# Patient Record
Sex: Female | Born: 1990 | Hispanic: Yes | Marital: Married | Smoking: Former smoker
Health system: Southern US, Community
[De-identification: ages and names within clinical notes are randomized; demographics above are authoritative.]

## PROBLEM LIST (undated history)

## (undated) DIAGNOSIS — J45909 Unspecified asthma, uncomplicated: Secondary | ICD-10-CM

## (undated) HISTORY — PX: CHOLECYSTECTOMY: SHX55

---

## 2015-03-28 NOTE — L&D Delivery Note (Signed)
Patient is 25 y.o. G2P1001 9372w5d admitted SOL   Delivery Note At 7:57 AM a viable female was delivered via  (Presentation: cephalic; ROA ).  APGAR: 8, 9; weight pending .   Placenta status: intact.  Cord: 3-vessel. Without complications  Anesthesia:  epidural Episiotomy:  none Lacerations:  none Suture Repair: none Est. Blood Loss (mL):  75  Mom to postpartum.  Baby to Couplet care / Skin to Skin.  Leland HerElsia J Yoo 12/25/2015, 8:12 AM      Upon arrival patient was complete and pushing. She pushed with good maternal effort to deliver a healthy baby girl. Baby delivered without difficulty, was noted to have good tone and place on maternal abdomen for oral suctioning, drying and stimulation. Delayed cord clamping performed. Placenta delivered intact with 3V cord. Vaginal canal and perineum was inspected and no repairs; hemostatic. Pitocin was started and uterus massaged until bleeding slowed. Counts of sharps, instruments, and lap pads were all correct.   Leland HerElsia J Yoo, DO PGY-1 9/30/20178:12 AM   Patient is a G2P1001 at 7172w5d who was admitted in SOL, uncomplicated prenatal course from records received.  She progressed with augmentation via Pitocin which was recently started.  I was gloved and present for delivery in its entirety.  Second stage of labor progressed to SVD.  Mild decels during second stage noted.  Complications: none  Lacerations: none  EBL: 75cc  Cam HaiSHAW, Shandon Matson, CNM 9:26 AM  12/25/2015

## 2015-08-31 LAB — OB RESULTS CONSOLE RUBELLA ANTIBODY, IGM: RUBELLA: IMMUNE

## 2015-10-14 LAB — OB RESULTS CONSOLE HIV ANTIBODY (ROUTINE TESTING): HIV: NONREACTIVE

## 2015-10-14 LAB — OB RESULTS CONSOLE RPR: RPR: NONREACTIVE

## 2015-11-19 LAB — OB RESULTS CONSOLE GC/CHLAMYDIA
CHLAMYDIA, DNA PROBE: NEGATIVE
GC PROBE AMP, GENITAL: NEGATIVE

## 2015-11-19 LAB — OB RESULTS CONSOLE GBS: GBS: NEGATIVE

## 2015-12-24 ENCOUNTER — Inpatient Hospital Stay (HOSPITAL_COMMUNITY)
Admission: AD | Admit: 2015-12-24 | Discharge: 2015-12-27 | DRG: 774 | Disposition: A | Discharge status: Home / Self Care | Payer: Medicaid Other | Source: Ambulatory Visit | Attending: Obstetrics & Gynecology | Admitting: Obstetrics & Gynecology

## 2015-12-24 ENCOUNTER — Encounter (HOSPITAL_COMMUNITY): Payer: Self-pay

## 2015-12-24 ENCOUNTER — Inpatient Hospital Stay (HOSPITAL_COMMUNITY): Payer: Medicaid Other | Admitting: Anesthesiology

## 2015-12-24 DIAGNOSIS — Z87891 Personal history of nicotine dependence: Secondary | ICD-10-CM | POA: Diagnosis not present

## 2015-12-24 DIAGNOSIS — Z3A40 40 weeks gestation of pregnancy: Secondary | ICD-10-CM

## 2015-12-24 DIAGNOSIS — IMO0001 Reserved for inherently not codable concepts without codable children: Secondary | ICD-10-CM

## 2015-12-24 DIAGNOSIS — Z3483 Encounter for supervision of other normal pregnancy, third trimester: Secondary | ICD-10-CM | POA: Diagnosis present

## 2015-12-24 HISTORY — DX: Unspecified asthma, uncomplicated: J45.909

## 2015-12-24 LAB — CBC
HEMATOCRIT: 42.7 % (ref 36.0–46.0)
Hemoglobin: 15.2 g/dL — ABNORMAL HIGH (ref 12.0–15.0)
MCH: 31.7 pg (ref 26.0–34.0)
MCHC: 35.6 g/dL (ref 30.0–36.0)
MCV: 89.1 fL (ref 78.0–100.0)
Platelets: 278 10*3/uL (ref 150–400)
RBC: 4.79 MIL/uL (ref 3.87–5.11)
RDW: 13.3 % (ref 11.5–15.5)
WBC: 13.8 10*3/uL — AB (ref 4.0–10.5)

## 2015-12-24 LAB — TYPE AND SCREEN
ABO/RH(D): O POS
Antibody Screen: NEGATIVE

## 2015-12-24 LAB — ABO/RH: ABO/RH(D): O POS

## 2015-12-24 LAB — HEPATITIS B SURFACE ANTIGEN: Hepatitis B Surface Ag: NEGATIVE

## 2015-12-24 LAB — POCT FERN TEST
POCT Fern Test: NEGATIVE
POCT Fern Test: NEGATIVE

## 2015-12-24 MED ORDER — LACTATED RINGERS IV SOLN: 500.0000 mL | Freq: Once | INTRAVENOUS | Status: DC

## 2015-12-24 MED ORDER — ONDANSETRON HCL 4 MG/2ML IJ SOLN: 4.0000 mg | Freq: Four times a day (QID) | INTRAMUSCULAR | Status: DC | PRN

## 2015-12-24 MED ORDER — OXYTOCIN 40 UNITS IN LACTATED RINGERS INFUSION - SIMPLE MED
2.5000 [IU]/h | INTRAVENOUS | Status: DC
  Filled 2015-12-24: qty 1000

## 2015-12-24 MED ORDER — EPHEDRINE 5 MG/ML INJ
10.0000 mg | INTRAVENOUS | Status: DC | PRN
  Filled 2015-12-24: qty 4

## 2015-12-24 MED ORDER — OXYCODONE-ACETAMINOPHEN 5-325 MG PO TABS: 1.0000 | ORAL_TABLET | ORAL | Status: DC | PRN

## 2015-12-24 MED ORDER — FENTANYL CITRATE (PF) 100 MCG/2ML IJ SOLN: 100.0000 ug | INTRAMUSCULAR | Status: DC | PRN

## 2015-12-24 MED ORDER — PHENYLEPHRINE 40 MCG/ML (10ML) SYRINGE FOR IV PUSH (FOR BLOOD PRESSURE SUPPORT)
80.0000 ug | PREFILLED_SYRINGE | INTRAVENOUS | Status: DC | PRN
  Filled 2015-12-24: qty 5

## 2015-12-24 MED ORDER — LIDOCAINE HCL (PF) 1 % IJ SOLN
INTRAMUSCULAR | Status: DC | PRN
  Administered 2015-12-24: 4 mL via EPIDURAL
  Administered 2015-12-24: 6 mL via EPIDURAL

## 2015-12-24 MED ORDER — DIPHENHYDRAMINE HCL 50 MG/ML IJ SOLN: 12.5000 mg | INTRAMUSCULAR | Status: DC | PRN

## 2015-12-24 MED ORDER — LACTATED RINGERS IV SOLN
INTRAVENOUS | Status: DC
  Administered 2015-12-24 – 2015-12-25 (×4): via INTRAVENOUS

## 2015-12-24 MED ORDER — SOD CITRATE-CITRIC ACID 500-334 MG/5ML PO SOLN: 30.0000 mL | ORAL | Status: DC | PRN

## 2015-12-24 MED ORDER — LACTATED RINGERS IV SOLN
500.0000 mL | INTRAVENOUS | Status: DC | PRN
  Administered 2015-12-24: 500 mL via INTRAVENOUS

## 2015-12-24 MED ORDER — OXYTOCIN BOLUS FROM INFUSION
500.0000 mL | Freq: Once | INTRAVENOUS | Status: AC
  Administered 2015-12-25: 500 mL via INTRAVENOUS

## 2015-12-24 MED ORDER — PHENYLEPHRINE 40 MCG/ML (10ML) SYRINGE FOR IV PUSH (FOR BLOOD PRESSURE SUPPORT)
80.0000 ug | PREFILLED_SYRINGE | INTRAVENOUS | Status: DC | PRN
  Filled 2015-12-24: qty 5
  Filled 2015-12-24: qty 10

## 2015-12-24 MED ORDER — LIDOCAINE HCL (PF) 1 % IJ SOLN
30.0000 mL | INTRAMUSCULAR | Status: DC | PRN
  Filled 2015-12-24: qty 30

## 2015-12-24 MED ORDER — OXYCODONE-ACETAMINOPHEN 5-325 MG PO TABS: 2.0000 | ORAL_TABLET | ORAL | Status: DC | PRN

## 2015-12-24 MED ORDER — FENTANYL 2.5 MCG/ML BUPIVACAINE 1/10 % EPIDURAL INFUSION (WH - ANES)
14.0000 mL/h | INTRAMUSCULAR | Status: DC | PRN
  Administered 2015-12-24 – 2015-12-25 (×2): 14 mL/h via EPIDURAL
  Filled 2015-12-24 (×2): qty 125

## 2015-12-24 MED ORDER — ACETAMINOPHEN 325 MG PO TABS: 650.0000 mg | ORAL_TABLET | ORAL | Status: DC | PRN

## 2015-12-24 NOTE — MAU Note (Signed)
PT having contractions, was not able to sleep last night. Every 30 min.

## 2015-12-24 NOTE — Anesthesia Preprocedure Evaluation (Signed)
Anesthesia Evaluation  Patient identified by MRN, date of birth, ID band Patient awake    Reviewed: Allergy & Precautions, NPO status , Patient's Chart, lab work & pertinent test results  History of Anesthesia Complications Negative for: history of anesthetic complications  Airway Mallampati: II  TM Distance: >3 FB Neck ROM: Full    Dental  (+) Teeth Intact, Dental Advisory Given   Pulmonary asthma , former smoker,    Pulmonary exam normal breath sounds clear to auscultation       Cardiovascular negative cardio ROS   Rhythm:Regular Rate:Normal     Neuro/Psych negative neurological ROS     GI/Hepatic negative GI ROS, Neg liver ROS,   Endo/Other  negative endocrine ROS  Renal/GU negative Renal ROS     Musculoskeletal   Abdominal (+) + obese,   Peds  Hematology negative hematology ROS (+)   Anesthesia Other Findings   Reproductive/Obstetrics (+) Pregnancy                             Anesthesia Physical Anesthesia Plan  ASA: II  Anesthesia Plan: Epidural   Post-op Pain Management:    Induction:   Airway Management Planned: Natural Airway  Additional Equipment:   Intra-op Plan:   Post-operative Plan:   Informed Consent: I have reviewed the patients History and Physical, chart, labs and discussed the procedure including the risks, benefits and alternatives for the proposed anesthesia with the patient or authorized representative who has indicated his/her understanding and acceptance.   Dental advisory given  Plan Discussed with:   Anesthesia Plan Comments: (History and consent obtained through use of a Spanish interpreter. I have discussed risks of neuraxial anesthesia including but not limited to infection, bleeding, nerve injury, back pain, headache, seizures, and failure of block. Patient denies bleeding disorders and is not currently anticoagulated. Labs have been  reviewed. Risks and benefits discussed. All patient's questions answered.   Platelets 278)        Anesthesia Quick Evaluation

## 2015-12-24 NOTE — Anesthesia Pain Management Evaluation Note (Signed)
  CRNA Pain Management Visit Note  Patient: Olivia Nguyen, 25 y.o., female  "Hello I am a [facility] anesthesia [facility]. We have an anesthesia team available at all times to provide care throughout the hospital, including epidural management and anesthesia for C-section. I don't know your plan for the delivery whether it a natural birth, water birth, IV sedation, nitrous supplementation, doula or epidural, but we want to meet your pain goals."   1.Was your pain managed to your expectations on prior hospitalizations?   Yes   2.What is your expectation for pain management during this hospitalization?     IV pain meds  3.How can we help you reach that goal? Spoke to patient through interpretor. Patient currently not planning on getting epidural. Told patient to tell RN if she decides she wants an epidural.  Record the patient's initial score and the patient's pain goal.   Pain: 7  Pain Goal: undecided [facility] wants you to be able to say your pain was always managed very well.  Quavis Klutz 12/24/2015

## 2015-12-24 NOTE — Anesthesia Procedure Notes (Signed)
Epidural Patient location during procedure: OB Start time: 12/24/2015 10:29 PM End time: 12/24/2015 10:33 PM  Staffing Anesthesiologist: Ardeen JourdainALLAN, Salia Cangemi DICKERSON  Preanesthetic Checklist Completed: patient identified, surgical consent, pre-op evaluation, timeout performed, IV checked, risks and benefits discussed and monitors and equipment checked  Epidural Patient position: sitting Prep: site prepped and draped and DuraPrep Patient monitoring: continuous pulse ox and blood pressure Approach: midline Location: L2-L3 Injection technique: LOR air  Needle:  Needle type: Tuohy  Needle gauge: 17 G Needle length: 9 cm and 9 Needle insertion depth: 6 cm Catheter type: closed end flexible Catheter size: 19 Gauge Catheter at skin depth: 11 cm Test dose: negative  Assessment Events: blood not aspirated, injection not painful, no injection resistance, negative IV test and no paresthesia  Additional Notes Reason for block:procedure for pain

## 2015-12-24 NOTE — MAU Note (Signed)
Olivia FatFern results ordered and resulted on the wrong pt. This pt had no fern testing done.

## 2015-12-24 NOTE — H&P (Signed)
Olivia Nguyen is a 25 y.o. female G2P1001 @ 40.4wks presenting for reg ctx. Denies leaking or bldg. [facility] and has been essentially unremarkable.  OB History    Gravida Para Term Preterm AB Living   2 1 1     1    SAB TAB Ectopic Multiple Live Births           1     History reviewed. No pertinent past medical history. Past Surgical History:  Procedure Laterality Date  . CHOLECYSTECTOMY     Family History: family history is not on file. Social History:  reports that she has quit smoking. She has never used smokeless tobacco. She reports that she drinks alcohol. She reports that she uses drugs.     Maternal Diabetes: No Genetic Screening: Declined Maternal Ultrasounds/Referrals: Normal Fetal Ultrasounds or other Referrals:  None Maternal Substance Abuse:  No Significant Maternal Medications:  None Significant Maternal Lab Results:  Lab values include: Group B Strep negative Other Comments:  None  ROS History Dilation: 5 Effacement (%): 90 Station: -2 Exam by:: Dorisann FramesAmanda Jones RN Blood pressure 118/73, pulse 96, temperature 97.8 F (36.6 C), temperature source Oral, resp. rate 17, SpO2 100 %. Exam Physical Exam  Constitutional: She is oriented to person, place, and time. She appears well-developed.  HENT:  Head: Normocephalic.  Neck: Normal range of motion.  Cardiovascular: Normal rate.   Respiratory: Effort normal.  Musculoskeletal: Normal range of motion.  Neurological: She is alert and oriented to person, place, and time.  Skin: Skin is warm and dry.  Psychiatric: She has a normal mood and affect. Her behavior is normal. Thought content normal.    Prenatal labs: ABO, Rh:  O+ Antibody:   Rubella:  immune RPR:   RPR HBsAg:   pending HIV:   NR GBS:   neg  Assessment/Plan: IUP@40 .4wks Early active labor  [facility] Anticipate SVD   Cam HaiSHAW, Colette Dicamillo CNM 12/24/2015, 6:03  PM

## 2015-12-25 ENCOUNTER — Encounter (HOSPITAL_COMMUNITY): Payer: Self-pay | Admitting: *Deleted

## 2015-12-25 DIAGNOSIS — Z87891 Personal history of nicotine dependence: Secondary | ICD-10-CM

## 2015-12-25 DIAGNOSIS — Z3A40 40 weeks gestation of pregnancy: Secondary | ICD-10-CM

## 2015-12-25 LAB — RPR: RPR Ser Ql: NONREACTIVE

## 2015-12-25 MED ORDER — MISOPROSTOL 200 MCG PO TABS
ORAL_TABLET | ORAL | Status: AC
  Filled 2015-12-25: qty 5

## 2015-12-25 MED ORDER — CARBOPROST TROMETHAMINE 250 MCG/ML IM SOLN
250.0000 ug | Freq: Once | INTRAMUSCULAR | Status: AC
  Administered 2015-12-25: 250 ug via INTRAMUSCULAR

## 2015-12-25 MED ORDER — WITCH HAZEL-GLYCERIN EX PADS: 1.0000 "application " | MEDICATED_PAD | CUTANEOUS | Status: DC | PRN

## 2015-12-25 MED ORDER — OXYTOCIN 40 UNITS IN LACTATED RINGERS INFUSION - SIMPLE MED
1.0000 m[IU]/min | INTRAVENOUS | Status: DC
  Administered 2015-12-25: 2 m[IU]/min via INTRAVENOUS

## 2015-12-25 MED ORDER — PRENATAL MULTIVITAMIN CH
1.0000 | ORAL_TABLET | Freq: Every day | ORAL | Status: DC
  Administered 2015-12-26 – 2015-12-27 (×2): 1 via ORAL
  Filled 2015-12-25 (×2): qty 1

## 2015-12-25 MED ORDER — METHYLERGONOVINE MALEATE 0.2 MG/ML IJ SOLN
INTRAMUSCULAR | Status: AC
  Filled 2015-12-25: qty 1

## 2015-12-25 MED ORDER — ONDANSETRON HCL 4 MG/2ML IJ SOLN: 4.0000 mg | INTRAMUSCULAR | Status: DC | PRN

## 2015-12-25 MED ORDER — MISOPROSTOL 200 MCG PO TABS
200.0000 ug | ORAL_TABLET | Freq: Once | ORAL | Status: AC
  Administered 2015-12-25: 200 ug via BUCCAL

## 2015-12-25 MED ORDER — COCONUT OIL OIL
1.0000 "application " | TOPICAL_OIL | Status: DC | PRN
  Filled 2015-12-25: qty 120

## 2015-12-25 MED ORDER — MISOPROSTOL 200 MCG PO TABS
1000.0000 ug | ORAL_TABLET | Freq: Once | ORAL | Status: AC
  Administered 2015-12-25: 1000 ug via RECTAL

## 2015-12-25 MED ORDER — METHYLERGONOVINE MALEATE 0.2 MG/ML IJ SOLN
0.2000 mg | Freq: Once | INTRAMUSCULAR | Status: AC
  Administered 2015-12-25: 0.2 mg via INTRAMUSCULAR

## 2015-12-25 MED ORDER — DIPHENHYDRAMINE HCL 25 MG PO CAPS: 25.0000 mg | ORAL_CAPSULE | Freq: Four times a day (QID) | ORAL | Status: DC | PRN

## 2015-12-25 MED ORDER — METHYLERGONOVINE MALEATE 0.2 MG PO TABS
0.2000 mg | ORAL_TABLET | ORAL | Status: AC
  Administered 2015-12-25 – 2015-12-26 (×6): 0.2 mg via ORAL
  Filled 2015-12-25 (×6): qty 1

## 2015-12-25 MED ORDER — TETANUS-DIPHTH-ACELL PERTUSSIS 5-2.5-18.5 LF-MCG/0.5 IM SUSP: 0.5000 mL | Freq: Once | INTRAMUSCULAR | Status: DC

## 2015-12-25 MED ORDER — CARBOPROST TROMETHAMINE 250 MCG/ML IM SOLN
INTRAMUSCULAR | Status: AC
  Filled 2015-12-25: qty 1

## 2015-12-25 MED ORDER — MISOPROSTOL 200 MCG PO TABS
ORAL_TABLET | ORAL | Status: AC
  Filled 2015-12-25: qty 1

## 2015-12-25 MED ORDER — METHYLERGONOVINE MALEATE 0.2 MG/ML IJ SOLN
0.2000 mg | Freq: Once | INTRAMUSCULAR | Status: AC
  Administered 2015-12-25: 0.2 mg via INTRAMUSCULAR
  Filled 2015-12-25: qty 1

## 2015-12-25 MED ORDER — ACETAMINOPHEN 325 MG PO TABS
650.0000 mg | ORAL_TABLET | ORAL | Status: DC | PRN
  Administered 2015-12-26: 650 mg via ORAL
  Filled 2015-12-25: qty 2

## 2015-12-25 MED ORDER — DIBUCAINE 1 % RE OINT: 1.0000 "application " | TOPICAL_OINTMENT | RECTAL | Status: DC | PRN

## 2015-12-25 MED ORDER — SIMETHICONE 80 MG PO CHEW: 80.0000 mg | CHEWABLE_TABLET | ORAL | Status: DC | PRN

## 2015-12-25 MED ORDER — SENNOSIDES-DOCUSATE SODIUM 8.6-50 MG PO TABS
2.0000 | ORAL_TABLET | ORAL | Status: DC
  Administered 2015-12-27: 2 via ORAL
  Filled 2015-12-25 (×2): qty 2

## 2015-12-25 MED ORDER — LOPERAMIDE HCL 2 MG PO CAPS
2.0000 mg | ORAL_CAPSULE | ORAL | Status: DC | PRN
  Administered 2015-12-25 (×2): 2 mg via ORAL
  Filled 2015-12-25 (×4): qty 1

## 2015-12-25 MED ORDER — BENZOCAINE-MENTHOL 20-0.5 % EX AERO: 1.0000 "application " | INHALATION_SPRAY | CUTANEOUS | Status: DC | PRN

## 2015-12-25 MED ORDER — TERBUTALINE SULFATE 1 MG/ML IJ SOLN
0.2500 mg | Freq: Once | INTRAMUSCULAR | Status: DC | PRN
  Filled 2015-12-25: qty 1

## 2015-12-25 MED ORDER — ZOLPIDEM TARTRATE 5 MG PO TABS
5.0000 mg | ORAL_TABLET | Freq: Every evening | ORAL | Status: DC | PRN
Start: 2015-12-25 — End: 2015-12-27

## 2015-12-25 MED ORDER — ONDANSETRON HCL 4 MG PO TABS
4.0000 mg | ORAL_TABLET | ORAL | Status: DC | PRN
  Administered 2015-12-25: 4 mg via ORAL
  Filled 2015-12-25: qty 1

## 2015-12-25 MED ORDER — IBUPROFEN 600 MG PO TABS
600.0000 mg | ORAL_TABLET | Freq: Four times a day (QID) | ORAL | Status: DC
  Administered 2015-12-25 – 2015-12-27 (×8): 600 mg via ORAL
  Filled 2015-12-25 (×8): qty 1

## 2015-12-25 NOTE — Progress Notes (Signed)
Patient ID: Olivia CulverSilvia Campos Nguyen, female   DOB: Nov 21, 1990, 25 y.o.   MRN: [id number]  Called for clots after uterine massage.    On exam, uterus enlarged with approx 150 cc of clots.    Clots were evacuated and uterus contracted down well.  No further bleeding.  One IM injection of methergine given.  Video Spanish interpreter used.

## 2015-12-25 NOTE — Progress Notes (Signed)
Patient ID: Olivia Nguyen, female   DOB: 08/29/1990, 25 y.o.   MRN: [id number] LABOR PROGRESS NOTE  Olivia Nguyen is a 25 y.o. G2P1001 at 2195w5d  admitted for SOL  Subjective: Doing well, comfortable.  Objective: BP 105/65   Pulse 97   Temp 97.7 F (36.5 C) (Oral)   Resp 18   Ht 5\' 1"  (1.549 m)   Wt 83.9 kg (185 lb)   SpO2 100%   BMI 34.96 kg/m  or  Vitals:   12/25/15 0000 12/25/15 0030 12/25/15 0100 12/25/15 0130  BP: 107/65 107/63 110/63 105/65  Pulse: 94 92 84 97  Resp:    18  Temp:    97.7 F (36.5 C)  TempSrc:    Oral  SpO2:      Weight:      Height:         Dilation: 7 Effacement (%): 90 Cervical Position: Middle Station: -2, -1 Presentation: Vertex Exam by:: Genelle BalE. Johnson, RN  FHT: 130 baseline, moderate variability, +accelerations, no decelerations Uterine activity: q3-445min  Labs: Lab Results  Component Value Date   WBC 13.8 (H) 12/24/2015   HGB 15.2 (H) 12/24/2015   HCT 42.7 12/24/2015   MCV 89.1 12/24/2015   PLT 278 12/24/2015    Patient Active Problem List   Diagnosis Date Noted  . Active labor at term 12/24/2015    Assessment / Plan: 25 y.o. G2P1001 at 3195w5d here for SOL  Labor: expectant management Fetal Wellbeing:  Category I Pain Control:  Planning on epidural Anticipated MOD:  SVD  Leland HerElsia J Enora Trillo, DO PGY-1 9/30/20172:13 AM

## 2015-12-25 NOTE — Lactation Note (Signed)
This note was copied from a baby's chart. Lactation Consultation Note Initial visit at 15 hours of age with spanish interpreter, Racquel.  Mom reports using a NS with older child due to inverted nipples.  Mom has small inverted nipples that evert some with hand pump and invert with compression.  Baby has difficulty latching and holding latch.  Mom reports baby has been very fussy.  Baby screaming at beginning of visit and placed STS, baby refused breast and encouraged mom to comfort baby.  Baby settled after passing flatulence.  Baby is sleepy now and does not appear hungry.  Baby has not had a good feeding yet.   LC fit mom with #20 NS and #24 is a better fit pulling nipple in base of shield.  [facility].  Interpreter had to leave room for an emergency.   Lc was able to get baby latched with NS and sucking intermittent for about 5 minutes.  No audible swallows at this time, but colostrum was noted in NS.  Baby asleep on mom.   Lc fit mom for #24 NS on left breast. Mom will need further teaching regarding use of NS, post pumping and follow up care with NS use.   [facility] resources given in Spanish and briefly discussed.  Mom to call for assist as needed.      Patient Name: Olivia Nguyen FAOZH'YToday's Date: 12/25/2015 Reason for consult: Initial assessment   Maternal Data Has patient been taught Hand Expression?: Yes Does the patient have breastfeeding experience prior to this delivery?: Yes  Feeding Feeding Type: Breast Fed Length of feed:  (5)  LATCH Score/Interventions Latch: Repeated attempts needed to sustain latch, nipple held in mouth throughout feeding, stimulation needed to elicit sucking reflex. Intervention(s): Skin to skin;Teach feeding cues;Waking techniques Intervention(s): Breast massage;Breast compression  Audible Swallowing: A few with stimulation Intervention(s): Skin to skin;Hand expression Intervention(s): Skin to skin;Hand expression  Type  of Nipple: Inverted Intervention(s): Hand pump  Comfort (Breast/Nipple): Soft / non-tender     Hold (Positioning): Assistance needed to correctly position infant at breast and maintain latch. Intervention(s): Breastfeeding basics reviewed;Support Pillows;Position options;Skin to skin  LATCH Score: 5  Lactation Tools Discussed/Used Tools: Nipple Shields Nipple shield size: 24   Consult Status Consult Status: Follow-up Date: 12/26/15 Follow-up type: In-patient    Jannifer RodneyShoptaw, Raiana Pharris Lynn 12/25/2015, 11:09 PM

## 2015-12-25 NOTE — Progress Notes (Signed)
Patient ID: Olivia Nguyen, female   DOB: 1991/02/09, 25 y.o.   MRN: [id number]  Comfortable w/ epidural; has been making cx change q 2 hrs until her exam at 0530 VSS, afeb FHR 140-150s, +accels, no decels Ctx difficult to trace Cx rim/+1/+2  IUP@term  Protracted active phase  Will start Pitocin  Cam HaiSHAW, KIMBERLY CNM 12/25/2015 6:49 AM

## 2015-12-25 NOTE — Progress Notes (Signed)
INTERPRETER AT BEDSIDE, PLAN OF CARE EXPLAINED, QUESTIONS ANSWERED

## 2015-12-25 NOTE — Progress Notes (Signed)
Patient ID: Olivia Nguyen, female   DOB: 05/07/1990, 25 y.o.   MRN: [id number]  Pt had two more uterine explorations for increased bleeding during fundal massage.  The first time 200 cc of clot were removed.  Bladder was just emptied and 500 cc of blood were removed.  One cytotec was removed from the rectum.  Hemabate and 200 mcg cytotec given buccal.    On second exploration, RN had just placed foley and got 500 cc more urine out.  20 cc blood came out.  No clots.  IM methergine given x1.  Will give po methergine series.    Now that foley is in place and bladder decompressed, uterus will stay contracted.    Lesly DukesLEGGETT,Olivia Sisler H., Olivia Nguyen

## 2015-12-25 NOTE — Progress Notes (Signed)
Spanish interpreter called. Explained to patient that cervix was unchanged from previous exam. Patient was informed of possibility  of augmentation with pitocin to facilitate cervical change. Patient declined but agreed to lateral positions using the peanut. Providers made aware, will recheck cervix in 1 hour.

## 2015-12-26 MED ORDER — METHYLERGONOVINE MALEATE 0.2 MG PO TABS: 0.2000 mg | ORAL_TABLET | ORAL | Status: DC | PRN

## 2015-12-26 NOTE — Progress Notes (Signed)
Post Partum Day 1 Subjective: no complaints, up ad lib, voiding and tolerating PO  Objective: Blood pressure 107/61, pulse 84, temperature 98 F (36.7 C), temperature source Oral, resp. rate 18, height 5\' 1"  (1.549 m), weight 185 lb (83.9 kg), SpO2 99 %, unknown if currently breastfeeding.  Physical Exam:  General: alert, cooperative and no distress Lochia: appropriate Uterine Fundus: firm Incision: n/a DVT Evaluation: No evidence of DVT seen on physical exam.   Recent Labs  12/24/15 1810  HGB 15.2*  HCT 42.7    Assessment/Plan: Plan for discharge tomorrow   LOS: 2 days   Olivia Nguyen 12/26/2015, 10:40 AM

## 2015-12-26 NOTE — Lactation Note (Signed)
This note was copied from a baby's chart. Lactation Consultation Note Follow up visit at 33 hours of age, with Spanish interpreter, HungaryMarta.  Mom reports last feeding several hours ago and is supplementing formula by bottle with 5mls at last feeding.  Baby noted to have pacifier in mouth, LC educated mom on paci use.  LC offered to assist with latching baby.  Mom prefers a modified side lying position and reports comfortable although looks awkward.  Mom does well applying NS.   Mom allows baby a shallow latch with shaft of NS visible. LC showed mom how to hold baby close for a deeper latch and mom continues to be concerned about baby breathing even after LC showed mom baby has air space at nose with position.  Baby latched for several minutes with intermittent strong sucking and few swallows. LC supports moms decision to supplement with formula as mom is not waking baby for feedings often and unsure of how well baby is transferring with NS.   Mom is post pumping for 15 minutes after feedings per her report, but not seeing colostrum with pumping.   LC encouraged mom to wake baby every 2 1/2 hours for STS and offer feedings and encouraged 8-12 feedings daily.  Mom denies concerns at this time.   Patient Name: Olivia Montel CulverSilvia Campos Hernandez YQMVH'QToday's Date: 12/26/2015 Reason for consult: Follow-up assessment   Maternal Data Has patient been taught Hand Expression?: Yes  Feeding Feeding Type: Breast Fed Length of feed:  (several minutes)  LATCH Score/Interventions Latch: Grasps breast easily, tongue down, lips flanged, rhythmical sucking. Intervention(s): Skin to skin;Teach feeding cues;Waking techniques Intervention(s): Breast massage;Breast compression  Audible Swallowing: A few with stimulation Intervention(s): Skin to skin;Hand expression  Type of Nipple: Flat Intervention(s):  (centers invert slightly) Intervention(s): Double electric pump  Comfort (Breast/Nipple): Soft / non-tender      Hold (Positioning): Assistance needed to correctly position infant at breast and maintain latch. Intervention(s): Breastfeeding basics reviewed;Support Pillows;Position options;Skin to skin  LATCH Score: 7  Lactation Tools Discussed/Used Tools: Nipple Shields Nipple shield size: 24 Pump Review: Setup, frequency, and cleaning;Milk Storage Initiated by:: js Date initiated:: 12/25/15   Consult Status Consult Status: Follow-up Date: 12/27/15 Follow-up type: In-patient    Kimiyah Blick, Arvella MerlesJana Lynn 12/26/2015, 5:32 PM

## 2015-12-26 NOTE — Progress Notes (Signed)
Plan of care discussed with patient via interpreter. Also discussed infant jaundice and plan for TcBs and likely TsB in am. Jaundice handout given in Spanish. Questions answered.

## 2015-12-27 MED ORDER — IBUPROFEN 600 MG PO TABS: 600.0000 mg | ORAL_TABLET | Freq: Four times a day (QID) | ORAL | 0 refills | Status: AC

## 2015-12-27 NOTE — Discharge Summary (Signed)
        OB Discharge Summary  Patient Name: Olivia Nguyen DOB: August 29, 1990 MRN: [id number]  Date of admission: 12/24/2015 Delivering MD: Leland HerYOO, ELSIA J   Date of discharge: 12/27/2015  Admitting diagnosis: 40WKS,PAIN Intrauterine pregnancy: 6766w5d     Secondary diagnosis:Active Problems:   Active labor at term  Additional problems:none     Discharge diagnosis: Term Pregnancy Delivered                                                                     Post partum procedures:none  Augmentation: Pitocin  Complications: Hemorrhage>107400mL  Hospital course:  Onset of Labor With Vaginal Delivery     25 y.o. yo [id number] at 3466w5d was admitted in Active Labor on 12/24/2015. Patient had an uncomplicated labor course as follows:  Membrane Rupture Time/Date: 7:44 PM ,12/24/2015   Intrapartum Procedures: Episiotomy: None [1]                                         Lacerations:  None [1]  Patient had a delivery of a Viable infant. 12/25/2015  Information for the patient's newborn:  Wynn MaudlinCampos Nguyen, Olivia Nguyen [[id number]][[id number]]  Delivery Method: Vag-Spont    Pateint had an uncomplicated postpartum course.  She is ambulating, tolerating a regular diet, passing flatus, and urinating well. Patient is discharged home in stable condition on 12/27/15.    Physical exam Vitals:   12/25/15 2220 12/26/15 0850 12/26/15 1800 12/27/15 0500  BP: 98/60 107/61 (!) 101/59 109/60  Pulse: 89 84 76 76  Resp: 18 18 17 18   Temp: 98.1 F (36.7 C) 98 F (36.7 C) 97.6 F (36.4 C) 97.6 F (36.4 C)  TempSrc: Oral Oral Oral Oral  SpO2: 99%     Weight:      Height:       General: alert, cooperative and no distress Lochia: appropriate Uterine Fundus: firm Incision: N/A DVT Evaluation: No evidence of DVT seen on physical exam. Labs: Lab Results  Component Value Date   WBC 13.8 (H) 12/24/2015   HGB 15.2 (H) 12/24/2015   HCT 42.7 12/24/2015   MCV 89.1 12/24/2015   PLT 278 12/24/2015   No flowsheet  data found.  Discharge instruction: per After Visit Summary and "Baby and Me Booklet".  After Visit Meds:    Medication List    TAKE these medications   ibuprofen 600 MG tablet Commonly known as:  ADVIL,MOTRIN Take 1 tablet (600 mg total) by mouth every 6 (six) hours.   multivitamin-prenatal 27-0.8 MG Tabs tablet Take 1 tablet by mouth daily at 12 noon.       Diet: routine diet  Activity: Advance as tolerated. Pelvic rest for 6 weeks.   Outpatient follow up:6 weeks Follow up Appt:No future appointments. Follow up visit: No Follow-up on file.  Postpartum contraception: IUD Mirena  Newborn Data: Live born female  Birth Weight: 7 lb 13 oz (3545 g) APGAR: 8, 9  Baby Feeding: Breast Disposition:home with mother   12/27/2015 Olivia Nguyen, CNM

## 2015-12-27 NOTE — Lactation Note (Signed)
This note was copied from a baby's chart. Lactation Consultation Note  Patient Name: Olivia Nguyen UJWJX'BToday's Date: 12/27/2015 Reason for consult: Follow-up assessment;Hyperbilirubinemia;Difficult latch Observed mom latch baby using nipple shield. Baby latched well and active suckling.  Few swallows noted.  Baby was started on phototherapy this AM.  Mom is supplementing with formula after breast.  Instructed to give baby 20-30 mls every 3 hours today.  Mom states she is not pumping because it hurts.  Flange size is good and when turning down the suction strength mom comfortable.  Instructed to post pump to stimulate milk supply.  Maternal Data    Feeding Feeding Type: Breast Fed Length of feed: 20 min  LATCH Score/Interventions Latch: Grasps breast easily, tongue down, lips flanged, rhythmical sucking. (with nipple shield) Intervention(s): Breast massage;Breast compression  Audible Swallowing: A few with stimulation  Type of Nipple: Flat Intervention(s): Double electric pump  Comfort (Breast/Nipple): Soft / non-tender     Hold (Positioning): No assistance needed to correctly position infant at breast. Intervention(s): Breastfeeding basics reviewed  LATCH Score: 8  Lactation Tools Discussed/Used Tools: Nipple Shields Nipple shield size: 24   Consult Status Consult Status: Follow-up Date: 12/28/15 Follow-up type: In-patient    Huston FoleyMOULDEN, Grisela Mesch S 12/27/2015, 9:32 AM

## 2015-12-28 NOTE — Anesthesia Postprocedure Evaluation (Signed)
Anesthesia Post Note  Patient: Olivia Nguyen  Procedure(s) Performed: * No procedures listed *  Patient location during evaluation: Mother Baby Anesthesia Type: Epidural Level of consciousness: awake and alert Pain management: pain level controlled Vital Signs Assessment: post-procedure vital signs reviewed and stable Respiratory status: spontaneous breathing, nonlabored ventilation and respiratory function stable Cardiovascular status: stable Postop Assessment: no headache, no backache and epidural receding Anesthetic complications: no     Last Vitals: There were no vitals filed for this visit.  Last Pain: There were no vitals filed for this visit. Pain Goal:                 Jiles GarterJACKSON,Yula Crotwell EDWARD

## 2015-12-30 ENCOUNTER — Emergency Department (HOSPITAL_COMMUNITY)
Admission: EM | Admit: 2015-12-30 | Discharge: 2015-12-31 | Disposition: A | Discharge status: Home / Self Care | Payer: Medicaid Other | Attending: Emergency Medicine | Admitting: Emergency Medicine

## 2015-12-30 DIAGNOSIS — N3000 Acute cystitis without hematuria: Secondary | ICD-10-CM

## 2015-12-30 DIAGNOSIS — B9689 Other specified bacterial agents as the cause of diseases classified elsewhere: Secondary | ICD-10-CM | POA: Insufficient documentation

## 2015-12-30 DIAGNOSIS — Z87891 Personal history of nicotine dependence: Secondary | ICD-10-CM | POA: Insufficient documentation

## 2015-12-30 DIAGNOSIS — O8689 Other specified puerperal infections: Secondary | ICD-10-CM | POA: Diagnosis not present

## 2015-12-30 DIAGNOSIS — J45909 Unspecified asthma, uncomplicated: Secondary | ICD-10-CM | POA: Diagnosis not present

## 2015-12-30 DIAGNOSIS — N719 Inflammatory disease of uterus, unspecified: Secondary | ICD-10-CM

## 2015-12-30 LAB — URINALYSIS, ROUTINE W REFLEX MICROSCOPIC
Bilirubin Urine: NEGATIVE
Glucose, UA: NEGATIVE mg/dL
Ketones, ur: NEGATIVE mg/dL
NITRITE: NEGATIVE
PH: 6 (ref 5.0–8.0)
Protein, ur: 30 mg/dL — AB
SPECIFIC GRAVITY, URINE: 1.01 (ref 1.005–1.030)

## 2015-12-30 LAB — CBC
HEMATOCRIT: 42.1 % (ref 36.0–46.0)
HEMOGLOBIN: 14 g/dL (ref 12.0–15.0)
MCH: 30.7 pg (ref 26.0–34.0)
MCHC: 33.3 g/dL (ref 30.0–36.0)
MCV: 92.3 fL (ref 78.0–100.0)
Platelets: 375 10*3/uL (ref 150–400)
RBC: 4.56 MIL/uL (ref 3.87–5.11)
RDW: 12.8 % (ref 11.5–15.5)
WBC: 13.2 10*3/uL — AB (ref 4.0–10.5)

## 2015-12-30 LAB — COMPREHENSIVE METABOLIC PANEL
ALBUMIN: 3.3 g/dL — AB (ref 3.5–5.0)
ALT: 97 U/L — ABNORMAL HIGH (ref 14–54)
ANION GAP: 9 (ref 5–15)
AST: 91 U/L — ABNORMAL HIGH (ref 15–41)
Alkaline Phosphatase: 118 U/L (ref 38–126)
BILIRUBIN TOTAL: 0.5 mg/dL (ref 0.3–1.2)
BUN: 10 mg/dL (ref 6–20)
CHLORIDE: 109 mmol/L (ref 101–111)
CO2: 22 mmol/L (ref 22–32)
Calcium: 9.3 mg/dL (ref 8.9–10.3)
Creatinine, Ser: 0.67 mg/dL (ref 0.44–1.00)
GFR calc Af Amer: >60 mL/min (ref >60)
GLUCOSE: 116 mg/dL — AB (ref 65–99)
POTASSIUM: 3.5 mmol/L (ref 3.5–5.1)
Sodium: 140 mmol/L (ref 135–145)
TOTAL PROTEIN: 6.3 g/dL — AB (ref 6.5–8.1)

## 2015-12-30 LAB — URINE MICROSCOPIC-ADD ON

## 2015-12-30 LAB — LIPASE, BLOOD: LIPASE: 28 U/L (ref 11–51)

## 2015-12-30 MED ORDER — DEXTROSE 5 % IV SOLN
1.0000 g | Freq: Once | INTRAVENOUS | Status: AC
  Administered 2015-12-30: 1 g via INTRAVENOUS
  Filled 2015-12-30: qty 10

## 2015-12-30 MED ORDER — KETOROLAC TROMETHAMINE 30 MG/ML IJ SOLN
30.0000 mg | Freq: Once | INTRAMUSCULAR | Status: AC
  Administered 2015-12-30: 30 mg via INTRAVENOUS
  Filled 2015-12-30: qty 1

## 2015-12-30 NOTE — ED Provider Notes (Signed)
[facility]-EMERGENCY DEPT Provider Note   CSN: [id number] Arrival date & time: 12/30/15  2024     History   Chief Complaint Chief Complaint  Patient presents with  . Abdominal Pain    HPI Olivia Nguyen is a 25 y.o. female.  HPI  This Is a 25 year old G59 female postpartum day 5 from a spontaneous vaginal delivery who presents with abdominal pain. Patient reports crampy intermittent abdominal pain since 2 AM this morning. It involves mostly her lower abdomen. It comes and goes. Nothing seems to make it better or worse. Current pain is 8 out of 10. She has not taken anything for the pain. She is concerned because she is breast-feeding. She reports no worsening bleeding. She does report persistent postpartum bleeding. She reports dysuria. Denies fevers.  Past Medical History:  Diagnosis Date  . Asthma     Patient Active Problem List   Diagnosis Date Noted  . Active labor at term 12/24/2015    Past Surgical History:  Procedure Laterality Date  . CHOLECYSTECTOMY      OB History    Gravida Para Term Preterm AB Living   2 2 2     2    SAB TAB Ectopic Multiple Live Births         0 2       Home Medications    Prior to Admission medications   Medication Sig Start Date End Date Taking? Authorizing Provider  amoxicillin-clavulanate (AUGMENTIN) 875-125 MG tablet Take 1 tablet by mouth every 12 (twelve) hours. 12/31/15   Shon Baton, MD  HYDROcodone-acetaminophen (NORCO/VICODIN) 5-325 MG tablet Take 1 tablet by mouth every 6 (six) hours as needed. 12/31/15   Shon Baton, MD  ibuprofen (ADVIL,MOTRIN) 600 MG tablet Take 1 tablet (600 mg total) by mouth every 6 (six) hours. 12/27/15   Montez Morita, CNM  metroNIDAZOLE (FLAGYL) 500 MG tablet Take 1 tablet (500 mg total) by mouth 2 (two) times daily. 12/31/15   Shon Baton, MD  naproxen (NAPROSYN) 500 MG tablet Take 1 tablet (500 mg total) by mouth 2 (two) times daily. 12/31/15   Shon Baton, MD  Prenatal  Vit-Fe Fumarate-FA (MULTIVITAMIN-PRENATAL) 27-0.8 MG TABS tablet Take 1 tablet by mouth daily at 12 noon.    Historical Provider, MD    Family History No family history on file.  Social History Social History  Substance Use Topics  . Smoking status: Former Games developer  . Smokeless tobacco: Never Used     Comment: Quit for years ago  . Alcohol use No     Allergies   Review of patient's allergies indicates no known allergies.   Review of Systems Review of Systems  Constitutional: Negative for fever.  Respiratory: Negative for shortness of breath.   Cardiovascular: Negative for leg swelling.  Gastrointestinal: Positive for abdominal pain. Negative for nausea and vomiting.  Genitourinary: Positive for dysuria and vaginal bleeding.  All other systems reviewed and are negative.    Physical Exam Updated Vital Signs BP 100/56   Pulse 74   Temp 98.1 F (36.7 C) (Oral)   Resp 17   Ht 5\' 5"  (1.651 m)   Wt 156 lb (70.8 kg)   SpO2 100%   BMI 25.96 kg/m   Physical Exam  Constitutional: She is oriented to person, place, and time. She appears well-developed and well-nourished.  Overweight  HENT:  Head: Normocephalic and atraumatic.  Cardiovascular: Normal rate, regular rhythm and normal heart sounds.   No murmur heard.  Pulmonary/Chest: Effort normal and breath sounds normal. No respiratory distress. She has no wheezes.  Abdominal: Soft. Bowel sounds are normal. She exhibits no mass. There is tenderness. There is no guarding.  Uterus firm but tender to palpation  Neurological: She is alert and oriented to person, place, and time.  Skin: Skin is warm and dry.  Psychiatric: She has a normal mood and affect.  Nursing note and vitals reviewed.    ED Treatments / Results  Labs (all labs ordered are listed, but only abnormal results are displayed) Labs Reviewed  COMPREHENSIVE METABOLIC PANEL - Abnormal; Notable for the following:       Result Value   Glucose, Bld 116 (*)     Total Protein 6.3 (*)    Albumin 3.3 (*)    AST 91 (*)    ALT 97 (*)    All other components within normal limits  CBC - Abnormal; Notable for the following:    WBC 13.2 (*)    All other components within normal limits  URINALYSIS, ROUTINE W REFLEX MICROSCOPIC (NOT AT [facility]) - Abnormal; Notable for the following:    Color, Urine AMBER (*)    APPearance CLOUDY (*)    Hgb urine dipstick LARGE (*)    Protein, ur 30 (*)    Leukocytes, UA LARGE (*)    All other components within normal limits  URINE MICROSCOPIC-ADD ON - Abnormal; Notable for the following:    Squamous Epithelial / LPF 0-5 (*)    Bacteria, UA FEW (*)    All other components within normal limits  URINE CULTURE  LIPASE, BLOOD    EKG  EKG Interpretation None       Radiology No results found.  Procedures Procedures (including critical care time)  Medications Ordered in ED Medications  cefTRIAXone (ROCEPHIN) 1 g in dextrose 5 % 50 mL IVPB (0 g Intravenous Stopped 12/31/15 0000)  ketorolac (TORADOL) 30 MG/ML injection 30 mg (30 mg Intravenous Given 12/30/15 2344)  HYDROcodone-acetaminophen (NORCO/VICODIN) 5-325 MG per tablet 1 tablet (1 tablet Oral Given 12/31/15 0116)  amoxicillin-clavulanate (AUGMENTIN) 875-125 MG per tablet 1 tablet (1 tablet Oral Given 12/31/15 0116)  metroNIDAZOLE (FLAGYL) tablet 500 mg (500 mg Oral Given 12/31/15 0116)     Initial Impression / Assessment and Plan / ED Course  I have reviewed the triage vital signs and the nursing notes.  Pertinent labs & imaging results that were available during my care of the patient were reviewed by me and considered in my medical decision making (see chart for details).  Clinical Course  Comment By Time  Discussed with Dr. Lowry RamHaraway Smith. Patient diffusely tender to uterine palpation on pelvic exam. No significant bleeding. Normal lochia.  She is afebrile.  Unchanged leukocytosis. Concern for early endometritis versus UTI. Will cover with Augmentin and  Flagyl at the recommendation of OB/GYN. Close follow-up recommended. Shon Batonourtney F Julieana Eshleman, MD 10/06 [phone]    Patient presents with abdominal pain and dysuria. Nontoxic. Afebrile. Vital signs reassuring. Diffusely tender on exam on exam but not peritoneal. Patient given Toradol. Urinalysis with likely UTI. Could also be early endometritis given tenderness. Stable leukocytosis. Discuss with OB/GYN as above. Patient was given 1 dose of IV Rocephin will be discharged with Augmentin and Flagyl. On repeat evaluation, patient pain 2 out of 10.  Will discharge with pain medication NSAID and norco and antibiotics as above. Patient is breast-feeding. Instructed to continue breast-feeding ad lib.  After history, exam, and medical workup I feel the patient  has been appropriately medically screened and is safe for discharge home. Pertinent diagnoses were discussed with the patient. Patient was given return precautions.  Final Clinical Impressions(s) / ED Diagnoses   Final diagnoses:  Endometritis  Acute cystitis without hematuria    New Prescriptions New Prescriptions   AMOXICILLIN-CLAVULANATE (AUGMENTIN) 875-125 MG TABLET    Take 1 tablet by mouth every 12 (twelve) hours.   HYDROCODONE-ACETAMINOPHEN (NORCO/VICODIN) 5-325 MG TABLET    Take 1 tablet by mouth every 6 (six) hours as needed.   METRONIDAZOLE (FLAGYL) 500 MG TABLET    Take 1 tablet (500 mg total) by mouth 2 (two) times daily.   NAPROXEN (NAPROSYN) 500 MG TABLET    Take 1 tablet (500 mg total) by mouth 2 (two) times daily.     Shon Baton, MD 12/31/15 0130

## 2015-12-30 NOTE — ED Triage Notes (Signed)
Pt states that has been having lower abd pain that started around 2am, suddenly, denies n/v, c/o diarrhea. Pt just had a baby on Saturday.

## 2015-12-30 NOTE — ED Notes (Signed)
Translator service used. [phone]

## 2015-12-31 MED ORDER — METRONIDAZOLE 500 MG PO TABS: 500.0000 mg | ORAL_TABLET | Freq: Two times a day (BID) | ORAL | 0 refills | Status: AC

## 2015-12-31 MED ORDER — NAPROXEN 500 MG PO TABS: 500.0000 mg | ORAL_TABLET | Freq: Two times a day (BID) | ORAL | 0 refills | Status: AC

## 2015-12-31 MED ORDER — METRONIDAZOLE 500 MG PO TABS
500.0000 mg | ORAL_TABLET | Freq: Once | ORAL | Status: AC
  Administered 2015-12-31: 500 mg via ORAL
  Filled 2015-12-31: qty 1

## 2015-12-31 MED ORDER — AMOXICILLIN-POT CLAVULANATE 875-125 MG PO TABS: 1.0000 | ORAL_TABLET | Freq: Two times a day (BID) | ORAL | 0 refills | Status: AC

## 2015-12-31 MED ORDER — HYDROCODONE-ACETAMINOPHEN 5-325 MG PO TABS
1.0000 | ORAL_TABLET | Freq: Once | ORAL | Status: AC
  Administered 2015-12-31: 1 via ORAL
  Filled 2015-12-31: qty 1

## 2015-12-31 MED ORDER — HYDROCODONE-ACETAMINOPHEN 5-325 MG PO TABS: 1.0000 | ORAL_TABLET | Freq: Four times a day (QID) | ORAL | 0 refills | Status: AC | PRN

## 2015-12-31 MED ORDER — AMOXICILLIN-POT CLAVULANATE 875-125 MG PO TABS
1.0000 | ORAL_TABLET | Freq: Once | ORAL | Status: AC
  Administered 2015-12-31: 1 via ORAL
  Filled 2015-12-31: qty 1

## 2016-01-01 LAB — URINE CULTURE: CULTURE: NO GROWTH

## 2016-09-20 DIAGNOSIS — A419 Sepsis, unspecified organism: Secondary | ICD-10-CM

## 2016-09-20 DIAGNOSIS — N390 Urinary tract infection, site not specified: Secondary | ICD-10-CM

## 2016-09-20 DIAGNOSIS — K760 Fatty (change of) liver, not elsewhere classified: Secondary | ICD-10-CM

## 2016-09-20 DIAGNOSIS — R109 Unspecified abdominal pain: Secondary | ICD-10-CM

## 2016-09-20 DIAGNOSIS — R740 Nonspecific elevation of levels of transaminase and lactic acid dehydrogenase [LDH]: Secondary | ICD-10-CM

## 2016-09-20 DIAGNOSIS — N201 Calculus of ureter: Secondary | ICD-10-CM

## 2016-09-20 DIAGNOSIS — E669 Obesity, unspecified: Secondary | ICD-10-CM

## 2016-09-20 DIAGNOSIS — N132 Hydronephrosis with renal and ureteral calculous obstruction: Secondary | ICD-10-CM

## 2019-10-12 ENCOUNTER — Other Ambulatory Visit: Payer: Self-pay

## 2019-10-12 ENCOUNTER — Emergency Department (HOSPITAL_COMMUNITY): Payer: Self-pay

## 2019-10-12 ENCOUNTER — Encounter (HOSPITAL_COMMUNITY): Payer: Self-pay

## 2019-10-12 ENCOUNTER — Emergency Department (HOSPITAL_COMMUNITY)
Admission: EM | Admit: 2019-10-12 | Discharge: 2019-10-12 | Disposition: A | Discharge status: Home / Self Care | Payer: Self-pay | Attending: Emergency Medicine | Admitting: Emergency Medicine

## 2019-10-12 DIAGNOSIS — R079 Chest pain, unspecified: Secondary | ICD-10-CM | POA: Insufficient documentation

## 2019-10-12 DIAGNOSIS — R0789 Other chest pain: Secondary | ICD-10-CM

## 2019-10-12 DIAGNOSIS — Z87891 Personal history of nicotine dependence: Secondary | ICD-10-CM | POA: Insufficient documentation

## 2019-10-12 DIAGNOSIS — E876 Hypokalemia: Secondary | ICD-10-CM

## 2019-10-12 DIAGNOSIS — R202 Paresthesia of skin: Secondary | ICD-10-CM | POA: Insufficient documentation

## 2019-10-12 DIAGNOSIS — R0602 Shortness of breath: Secondary | ICD-10-CM | POA: Insufficient documentation

## 2019-10-12 DIAGNOSIS — J45909 Unspecified asthma, uncomplicated: Secondary | ICD-10-CM | POA: Insufficient documentation

## 2019-10-12 DIAGNOSIS — R064 Hyperventilation: Secondary | ICD-10-CM | POA: Insufficient documentation

## 2019-10-12 DIAGNOSIS — R0600 Dyspnea, unspecified: Secondary | ICD-10-CM

## 2019-10-12 LAB — BASIC METABOLIC PANEL
Anion gap: 16 — ABNORMAL HIGH (ref 5–15)
BUN: 7 mg/dL (ref 6–20)
CO2: 18 mmol/L — ABNORMAL LOW (ref 22–32)
Calcium: 9.5 mg/dL (ref 8.9–10.3)
Chloride: 106 mmol/L (ref 98–111)
Creatinine, Ser: 0.75 mg/dL (ref 0.44–1.00)
GFR calc Af Amer: >60 mL/min (ref >60)
GFR calc non Af Amer: >60 mL/min (ref >60)
Glucose, Bld: 97 mg/dL (ref 70–99)
Potassium: 2.4 mmol/L — CL (ref 3.5–5.1)
Sodium: 140 mmol/L (ref 135–145)

## 2019-10-12 LAB — TROPONIN I (HIGH SENSITIVITY)
Troponin I (High Sensitivity): <2 ng/L (ref <18)
Troponin I (High Sensitivity): 2 ng/L (ref <18)

## 2019-10-12 LAB — CBC WITH DIFFERENTIAL/PLATELET
Abs Immature Granulocytes: 0.03 10*3/uL (ref 0.00–0.07)
Basophils Absolute: 0.1 10*3/uL (ref 0.0–0.1)
Basophils Relative: 1 %
Eosinophils Absolute: 0.1 10*3/uL (ref 0.0–0.5)
Eosinophils Relative: 1 %
HCT: 40.3 % (ref 36.0–46.0)
Hemoglobin: 14.2 g/dL (ref 12.0–15.0)
Immature Granulocytes: 0 %
Lymphocytes Relative: 54 %
Lymphs Abs: 6 10*3/uL — ABNORMAL HIGH (ref 0.7–4.0)
MCH: 31 pg (ref 26.0–34.0)
MCHC: 35.2 g/dL (ref 30.0–36.0)
MCV: 88 fL (ref 80.0–100.0)
Monocytes Absolute: 0.9 10*3/uL (ref 0.1–1.0)
Monocytes Relative: 8 %
Neutro Abs: 4 10*3/uL (ref 1.7–7.7)
Neutrophils Relative %: 36 %
Platelets: 311 10*3/uL (ref 150–400)
RBC: 4.58 MIL/uL (ref 3.87–5.11)
RDW: 11.7 % (ref 11.5–15.5)
WBC: 11.3 10*3/uL — ABNORMAL HIGH (ref 4.0–10.5)
nRBC: 0 % (ref 0.0–0.2)

## 2019-10-12 MED ORDER — POTASSIUM CHLORIDE 10 MEQ/100ML IV SOLN
10.0000 meq | Freq: Once | INTRAVENOUS | Status: AC
  Administered 2019-10-12: 10 meq via INTRAVENOUS
  Filled 2019-10-12: qty 100

## 2019-10-12 MED ORDER — POTASSIUM CHLORIDE CRYS ER 20 MEQ PO TBCR: 20.0000 meq | EXTENDED_RELEASE_TABLET | Freq: Two times a day (BID) | ORAL | 0 refills | Status: AC

## 2019-10-12 MED ORDER — POTASSIUM CHLORIDE CRYS ER 20 MEQ PO TBCR
40.0000 meq | EXTENDED_RELEASE_TABLET | Freq: Once | ORAL | Status: AC
  Administered 2019-10-12: 40 meq via ORAL
  Filled 2019-10-12: qty 2

## 2019-10-12 MED ORDER — LORAZEPAM 1 MG PO TABS
2.0000 mg | ORAL_TABLET | Freq: Once | ORAL | Status: AC
  Administered 2019-10-12: 2 mg via ORAL
  Filled 2019-10-12: qty 2

## 2019-10-12 MED ORDER — ALBUTEROL SULFATE HFA 108 (90 BASE) MCG/ACT IN AERS
2.0000 | INHALATION_SPRAY | Freq: Once | RESPIRATORY_TRACT | Status: AC
  Administered 2019-10-12: 2 via RESPIRATORY_TRACT
  Filled 2019-10-12: qty 6.7
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
## 2019-10-12 NOTE — ED Notes (Signed)
Dr. Judd Lien aware of pt critical potassium level. New orders to be placed.

## 2019-10-12 NOTE — Discharge Instructions (Signed)
Begin taking potassium replacement as prescribed.  Take ibuprofen 600 mg every 6 hours as needed for pain.  Follow-up with your primary doctor if symptoms or not improving in the next few days, and return to the ER if symptoms significantly worsen or change.

## 2020-09-06 ENCOUNTER — Encounter (HOSPITAL_BASED_OUTPATIENT_CLINIC_OR_DEPARTMENT_OTHER): Payer: Self-pay | Admitting: Emergency Medicine

## 2020-09-06 ENCOUNTER — Other Ambulatory Visit: Payer: Self-pay

## 2020-09-06 ENCOUNTER — Emergency Department (HOSPITAL_BASED_OUTPATIENT_CLINIC_OR_DEPARTMENT_OTHER): Payer: Self-pay

## 2020-09-06 ENCOUNTER — Emergency Department (HOSPITAL_BASED_OUTPATIENT_CLINIC_OR_DEPARTMENT_OTHER)
Admission: EM | Admit: 2020-09-06 | Discharge: 2020-09-06 | Disposition: A | Discharge status: Home / Self Care | Payer: Self-pay | Attending: Emergency Medicine | Admitting: Emergency Medicine

## 2020-09-06 DIAGNOSIS — T7491XA Unspecified adult maltreatment, confirmed, initial encounter: Secondary | ICD-10-CM

## 2020-09-06 DIAGNOSIS — R519 Headache, unspecified: Secondary | ICD-10-CM | POA: Insufficient documentation

## 2020-09-06 DIAGNOSIS — H538 Other visual disturbances: Secondary | ICD-10-CM | POA: Insufficient documentation

## 2020-09-06 DIAGNOSIS — Z87828 Personal history of other (healed) physical injury and trauma: Secondary | ICD-10-CM

## 2020-09-06 DIAGNOSIS — M542 Cervicalgia: Secondary | ICD-10-CM | POA: Insufficient documentation

## 2020-09-06 DIAGNOSIS — J45909 Unspecified asthma, uncomplicated: Secondary | ICD-10-CM | POA: Insufficient documentation

## 2020-09-06 DIAGNOSIS — Z87891 Personal history of nicotine dependence: Secondary | ICD-10-CM | POA: Insufficient documentation

## 2020-09-06 DIAGNOSIS — Z91410 Personal history of adult physical and sexual abuse: Secondary | ICD-10-CM | POA: Insufficient documentation

## 2020-09-06 LAB — CBC WITH DIFFERENTIAL/PLATELET
Abs Immature Granulocytes: 0.02 10*3/uL (ref 0.00–0.07)
Basophils Absolute: 0.1 10*3/uL (ref 0.0–0.1)
Basophils Relative: 1 %
Eosinophils Absolute: 0.3 10*3/uL (ref 0.0–0.5)
Eosinophils Relative: 3 %
HCT: 42.5 % (ref 36.0–46.0)
Hemoglobin: 14.8 g/dL (ref 12.0–15.0)
Immature Granulocytes: 0 %
Lymphocytes Relative: 44 %
Lymphs Abs: 4 10*3/uL (ref 0.7–4.0)
MCH: 32.2 pg (ref 26.0–34.0)
MCHC: 34.8 g/dL (ref 30.0–36.0)
MCV: 92.6 fL (ref 80.0–100.0)
Monocytes Absolute: 0.7 10*3/uL (ref 0.1–1.0)
Monocytes Relative: 7 %
Neutro Abs: 4.1 10*3/uL (ref 1.7–7.7)
Neutrophils Relative %: 45 %
Platelets: 285 10*3/uL (ref 150–400)
RBC: 4.59 MIL/uL (ref 3.87–5.11)
RDW: 12.4 % (ref 11.5–15.5)
WBC: 9.2 10*3/uL (ref 4.0–10.5)
nRBC: 0 % (ref 0.0–0.2)

## 2020-09-06 LAB — BASIC METABOLIC PANEL
Anion gap: 6 (ref 5–15)
BUN: 9 mg/dL (ref 6–20)
CO2: 26 mmol/L (ref 22–32)
Calcium: 9.2 mg/dL (ref 8.9–10.3)
Chloride: 107 mmol/L (ref 98–111)
Creatinine, Ser: 0.54 mg/dL (ref 0.44–1.00)
GFR, Estimated: >60 mL/min (ref >60)
Glucose, Bld: 112 mg/dL — ABNORMAL HIGH (ref 70–99)
Potassium: 3.7 mmol/L (ref 3.5–5.1)
Sodium: 139 mmol/L (ref 135–145)

## 2020-09-06 LAB — PREGNANCY, URINE: Preg Test, Ur: NEGATIVE

## 2020-09-06 MED ORDER — IOHEXOL 350 MG/ML SOLN
80.0000 mL | Freq: Once | INTRAVENOUS | Status: AC | PRN
  Administered 2020-09-06: 80 mL via INTRAVENOUS

## 2020-09-06 MED ORDER — ACETAMINOPHEN 500 MG PO TABS
1000.0000 mg | ORAL_TABLET | Freq: Once | ORAL | Status: AC
  Administered 2020-09-06: 1000 mg via ORAL
  Filled 2020-09-06: qty 2
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
## 2020-09-06 NOTE — ED Notes (Signed)
Patient transported to CT 
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
